# Patient Record
Sex: Female | Born: 1960 | Race: White | Hispanic: No | Smoking: Never smoker
Health system: Southern US, Community
[De-identification: ages and names within clinical notes are randomized; demographics above are authoritative.]

---

## 2017-10-16 ENCOUNTER — Other Ambulatory Visit: Payer: Self-pay | Admitting: Chiropractic Medicine

## 2017-10-16 ENCOUNTER — Ambulatory Visit
Admission: RE | Admit: 2017-10-16 | Discharge: 2017-10-16 | Disposition: A | Discharge status: Home / Self Care | Payer: POS | Source: Ambulatory Visit | Attending: Chiropractic Medicine | Admitting: Chiropractic Medicine

## 2017-10-16 DIAGNOSIS — M542 Cervicalgia: Secondary | ICD-10-CM

## 2017-10-16 DIAGNOSIS — M25532 Pain in left wrist: Principal | ICD-10-CM

## 2017-10-16 DIAGNOSIS — M25531 Pain in right wrist: Secondary | ICD-10-CM

## 2018-04-29 ENCOUNTER — Encounter: Payer: Self-pay | Admitting: Family Medicine

## 2018-04-29 ENCOUNTER — Ambulatory Visit (INDEPENDENT_AMBULATORY_CARE_PROVIDER_SITE_OTHER): Payer: Self-pay | Admitting: Family Medicine

## 2018-04-29 ENCOUNTER — Ambulatory Visit (HOSPITAL_BASED_OUTPATIENT_CLINIC_OR_DEPARTMENT_OTHER)
Admission: RE | Admit: 2018-04-29 | Discharge: 2018-04-29 | Disposition: A | Discharge status: Home / Self Care | Payer: Managed Care, Other (non HMO) | Source: Ambulatory Visit | Attending: Family Medicine | Admitting: Family Medicine

## 2018-04-29 VITALS — BP 110/73 | HR 75 | Ht 64.0 in | Wt 145.0 lb

## 2018-04-29 DIAGNOSIS — S6991XA Unspecified injury of right wrist, hand and finger(s), initial encounter: Secondary | ICD-10-CM | POA: Diagnosis not present

## 2018-04-29 DIAGNOSIS — S6992XA Unspecified injury of left wrist, hand and finger(s), initial encounter: Secondary | ICD-10-CM

## 2018-04-29 MED ORDER — METHYLPREDNISOLONE ACETATE 40 MG/ML IJ SUSP
20.0000 mg | Freq: Once | INTRAMUSCULAR | Status: AC
  Administered 2018-04-29: 20 mg via INTRA_ARTICULAR

## 2018-04-29 NOTE — Patient Instructions (Addendum)
Your pain is due to arthritis at your [facility] joints These are the different medications you can take for this: Tylenol 500mg  1-2 tabs three times a day for pain. Capsaicin, aspercreme, or biofreeze topically up to four times a day may also help with pain. Some supplements that may help for arthritis: Boswellia extract, curcumin, pycnogenol Aleve 1-2 tabs twice a day with food Cortisone injections are an option - you were given these today. Heat or ice 15 minutes at a time 3-4 times a day as needed to help with pain. Follow up with me in 1 month.

## 2018-04-30 ENCOUNTER — Encounter: Payer: Self-pay | Admitting: Family Medicine
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
## 2018-11-25 ENCOUNTER — Other Ambulatory Visit: Payer: Self-pay

## 2018-11-25 ENCOUNTER — Ambulatory Visit (INDEPENDENT_AMBULATORY_CARE_PROVIDER_SITE_OTHER): Payer: Managed Care, Other (non HMO) | Admitting: Family Medicine

## 2018-11-25 ENCOUNTER — Encounter: Payer: Self-pay | Admitting: Family Medicine

## 2018-11-25 VITALS — BP 108/64 | Ht 64.0 in | Wt 130.0 lb

## 2018-11-25 DIAGNOSIS — M79645 Pain in left finger(s): Secondary | ICD-10-CM

## 2018-11-25 DIAGNOSIS — M79644 Pain in right finger(s): Secondary | ICD-10-CM | POA: Diagnosis not present

## 2018-11-25 NOTE — Patient Instructions (Addendum)
Your pain is due to arthritis at your [facility] joints These are the different medications you can take for this: Tylenol 500mg  1-2 tabs three times a day for pain. Capsaicin, aspercreme, or biofreeze topically up to four times a day may also help with pain. Some supplements that may help for arthritis: Boswellia extract, curcumin, pycnogenol Aleve 1-2 tabs twice a day with food Heat or ice 15 minutes at a time 3-4 times a day as needed to help with pain. Consider repeat steroid injections but we will have you see the hand surgeons first - they can consider doing this as well. We have scheduled you to see Dr. Burney Gauze [facility] [address] Appt: 12/03/2018 @ 8:30 am   Follow up with me in 6 weeks.
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
## 2019-06-23 ENCOUNTER — Other Ambulatory Visit: Payer: Self-pay | Admitting: Nurse Practitioner
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
